# Patient Record
Sex: Male | Born: 1970 | Hispanic: Yes | Marital: Married | Smoking: Former smoker
Health system: Southern US, Community
[De-identification: ages and names within clinical notes are randomized; demographics above are authoritative.]

## PROBLEM LIST (undated history)

## (undated) DIAGNOSIS — Z87442 Personal history of urinary calculi: Secondary | ICD-10-CM

## (undated) DIAGNOSIS — E559 Vitamin D deficiency, unspecified: Secondary | ICD-10-CM

## (undated) HISTORY — DX: Personal history of urinary calculi: Z87.442

## (undated) HISTORY — DX: Vitamin D deficiency, unspecified: E55.9

## (undated) HISTORY — PX: PALATE / UVULA BIOPSY / EXCISION: SUR128

---

## 2015-09-25 DIAGNOSIS — E559 Vitamin D deficiency, unspecified: Secondary | ICD-10-CM | POA: Insufficient documentation

## 2016-10-28 HISTORY — PX: EXTRACORPOREAL SHOCK WAVE LITHOTRIPSY: SHX1557

## 2019-04-08 DIAGNOSIS — N200 Calculus of kidney: Secondary | ICD-10-CM | POA: Insufficient documentation

## 2019-04-09 ENCOUNTER — Encounter: Payer: Self-pay | Admitting: Internal Medicine

## 2019-04-09 ENCOUNTER — Other Ambulatory Visit: Payer: Self-pay

## 2019-04-09 ENCOUNTER — Ambulatory Visit (INDEPENDENT_AMBULATORY_CARE_PROVIDER_SITE_OTHER): Payer: 59 | Admitting: Internal Medicine

## 2019-04-09 ENCOUNTER — Encounter: Payer: Self-pay | Admitting: Gastroenterology

## 2019-04-09 VITALS — BP 118/85 | HR 65 | Temp 96.8°F | Resp 16 | Ht 71.0 in | Wt 176.0 lb

## 2019-04-09 DIAGNOSIS — Z800 Family history of malignant neoplasm of digestive organs: Secondary | ICD-10-CM

## 2019-04-09 DIAGNOSIS — K648 Other hemorrhoids: Secondary | ICD-10-CM | POA: Diagnosis not present

## 2019-04-09 DIAGNOSIS — M25512 Pain in left shoulder: Secondary | ICD-10-CM

## 2019-04-09 DIAGNOSIS — Z125 Encounter for screening for malignant neoplasm of prostate: Secondary | ICD-10-CM | POA: Diagnosis not present

## 2019-04-09 LAB — CBC WITH DIFFERENTIAL/PLATELET
Basophils Absolute: 0 10*3/uL (ref 0.0–0.1)
Basophils Relative: 0.4 % (ref 0.0–3.0)
Eosinophils Absolute: 0.1 10*3/uL (ref 0.0–0.7)
Eosinophils Relative: 1.4 % (ref 0.0–5.0)
HCT: 42.6 % (ref 39.0–52.0)
Hemoglobin: 14.4 g/dL (ref 13.0–17.0)
Lymphocytes Relative: 30.2 % (ref 12.0–46.0)
Lymphs Abs: 2.1 10*3/uL (ref 0.7–4.0)
MCHC: 33.9 g/dL (ref 30.0–36.0)
MCV: 91 fl (ref 78.0–100.0)
Monocytes Absolute: 0.3 10*3/uL (ref 0.1–1.0)
Monocytes Relative: 4.8 % (ref 3.0–12.0)
Neutro Abs: 4.4 10*3/uL (ref 1.4–7.7)
Neutrophils Relative %: 63.2 % (ref 43.0–77.0)
Platelets: 200 10*3/uL (ref 150.0–400.0)
RBC: 4.68 Mil/uL (ref 4.22–5.81)
RDW: 12.4 % (ref 11.5–15.5)
WBC: 6.9 10*3/uL (ref 4.0–10.5)

## 2019-04-09 LAB — BASIC METABOLIC PANEL
BUN: 18 mg/dL (ref 6–23)
CO2: 32 mEq/L (ref 19–32)
Calcium: 9.7 mg/dL (ref 8.4–10.5)
Chloride: 101 mEq/L (ref 96–112)
Creatinine, Ser: 0.86 mg/dL (ref 0.40–1.50)
GFR: 93.69 mL/min (ref >60.00)
Glucose, Bld: 86 mg/dL (ref 70–99)
Potassium: 4.3 mEq/L (ref 3.5–5.1)
Sodium: 138 mEq/L (ref 135–145)

## 2019-04-09 LAB — PSA: PSA: 0.38 ng/mL (ref 0.10–4.00)

## 2019-04-09 MED ORDER — HYDROCORTISONE ACETATE 25 MG RE SUPP: 25.0000 mg | Freq: Two times a day (BID) | RECTAL | 1 refills | Status: AC | PRN

## 2019-04-09 NOTE — Progress Notes (Signed)
Pre visit review using our clinic review tool, if applicable. No additional management support is needed unless otherwise documented below in the visit note. 

## 2019-04-09 NOTE — Progress Notes (Signed)
Subjective:    Patient ID: Shane Chavez, male    DOB: 1970/07/28, 50 y.o.   MRN: [id number]  DOS:  04/09/2019 Type of visit - description: New patient  To get established  Has a long history of internal hemorrhoids, for the last few months have noticed occasional bleeding with bowel movements, typically small amounts of blood in the toilet paper.  Stools are normal color, blood is not mixed with the stools.  2 weeks ago did some heavy lifting, shortly after developed left deltoid shoulder pain.  No neck pain.   Review of Systems Denies fever chills.  No weight loss No abdominal pain No nausea or vomiting Denies major problems with rectal pain or itching. No neck pain   Past Medical History:  Diagnosis Date  . History of kidney stones   . Vitamin D deficiency    Family History  Problem Relation Age of Onset  . Hypertension Mother   . Colon cancer Sister 65  . Diabetes Neg Hx   . Prostate cancer Neg Hx   . CAD Neg Hx     Past Surgical History:  Procedure Laterality Date  . EXTRACORPOREAL SHOCK WAVE LITHOTRIPSY Left 10/28/2016    Allergies as of 04/09/2019   No Known Allergies     Medication List       Accurate as of April 09, 2019 11:59 PM. If you have any questions, ask your nurse or doctor.        acetaminophen 500 MG tablet Commonly known as: TYLENOL Take 500 mg by mouth every 6 (six) hours as needed for moderate pain.   hydrocortisone 25 MG suppository Commonly known as: ANUSOL-HC Place 1 suppository (25 mg total) rectally 2 (two) times daily as needed for hemorrhoids. Started by: Willow Ora, MD   valACYclovir 500 MG tablet Commonly known as: VALTREX Take 500 mg by mouth daily.   VITAMIN C PO Take by mouth.   VITAMIN D PO Take by mouth.          Objective:   Physical Exam BP 118/85 (BP Location: Left Arm, Patient Position: Sitting, Cuff Size: Normal)   Pulse 65   Temp (!) 96.8 F (36 C) (Temporal)   Resp 16   Ht 5\' 11"  (1.803 m)    Wt 176 lb (79.8 kg)   SpO2 100%   BMI 24.55 kg/m  General:   Well developed, NAD, BMI noted.  HEENT:  Normocephalic . Face symmetric, atraumatic Lungs:  CTA B Normal respiratory effort, no intercostal retractions, no accessory muscle use. Heart: RRR,  no murmur.  Abdomen:  Not distended, soft, non-tender. No rebound or rigidity. DRE : Externally has a skin tags, normal sphincter tone, prostate is normal. Anoscopy: Multiple internal hemorrhoids skin: Not pale. Not jaundice Lower extremities: no pretibial edema bilaterally  Neurologic:  alert & oriented X3.  Speech normal, gait appropriate for age and unassisted Psych--  Cognition and judgment appear intact.  Cooperative with normal attention span and concentration.  Behavior appropriate. No anxious or depressed appearing.     Assessment     ASSESSMENT (new patient) History of kidney stones Vitamin D deficiency Internal hemorrhoids  PLAN New patient, to get established Internal hemorrhoids: Intermittent rectal bleeding likely from internal hemorrhoids, DRE anoscopy consistent with internal none hemorrhoids, recommend a high-fiber diet, Anusol suppositories as needed. Check BMP and CBC. Prostate cancer screening: Since we did a DRE, I will check a PSA to complete prostate cancer screening. Denies symptoms. Shoulder injury: Refer to  sports medicine + FH colon cancer, sister at age 49, never had a colonoscopy, referred to GI for a screening colonoscopy RTC CPX 6 months   This visit occurred during the SARS-CoV-2 public health emergency.  Safety protocols were in place, including screening questions prior to the visit, additional usage of staff PPE, and extensive cleaning of exam room while observing appropriate contact time as indicated for disinfecting solutions.

## 2019-04-09 NOTE — Patient Instructions (Signed)
For hemorrhoids: High-fiber diet (fruits, vegetables) and Metamucil 1 capsule daily. Use a suppository when needed. Call if severe pain or bleeding.  We are referring you for a colonoscopy and also to the sports medicine doctor for shoulder pain   GO TO THE LAB : Get the blood work     GO TO THE FRONT DESK, please reschedule your appointments Come back for a physical  exam in 6 months

## 2019-04-10 DIAGNOSIS — Z09 Encounter for follow-up examination after completed treatment for conditions other than malignant neoplasm: Secondary | ICD-10-CM | POA: Insufficient documentation

## 2019-04-10 NOTE — Assessment & Plan Note (Signed)
New patient, to get established Internal hemorrhoids: Intermittent rectal bleeding likely from internal hemorrhoids, DRE anoscopy consistent with internal none hemorrhoids, recommend a high-fiber diet, Anusol suppositories as needed. Check BMP and CBC. Prostate cancer screening: Since we did a DRE, I will check a PSA to complete prostate cancer screening. Denies symptoms. Shoulder injury: Refer to sports medicine + FH colon cancer, sister at age 51, never had a colonoscopy, referred to GI for a screening colonoscopy RTC CPX 6 months

## 2019-05-14 ENCOUNTER — Ambulatory Visit: Payer: Self-pay

## 2019-05-14 ENCOUNTER — Other Ambulatory Visit: Payer: Self-pay

## 2019-05-14 ENCOUNTER — Ambulatory Visit (AMBULATORY_SURGERY_CENTER): Payer: Self-pay | Admitting: *Deleted

## 2019-05-14 ENCOUNTER — Ambulatory Visit (INDEPENDENT_AMBULATORY_CARE_PROVIDER_SITE_OTHER): Payer: No Typology Code available for payment source | Admitting: Family Medicine

## 2019-05-14 ENCOUNTER — Encounter: Payer: Self-pay | Admitting: Family Medicine

## 2019-05-14 VITALS — BP 120/72 | HR 71 | Ht 71.0 in | Wt 175.2 lb

## 2019-05-14 VITALS — Temp 97.5°F | Ht 71.0 in | Wt 175.0 lb

## 2019-05-14 DIAGNOSIS — M25512 Pain in left shoulder: Secondary | ICD-10-CM | POA: Diagnosis not present

## 2019-05-14 DIAGNOSIS — Z800 Family history of malignant neoplasm of digestive organs: Secondary | ICD-10-CM

## 2019-05-14 NOTE — Patient Instructions (Addendum)
Thank you for coming in today.  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  I agree with avoiding heavy lifting.  I think physical therapy is a good idea.  I placed a referral to PT at [facility].  Recheck with me in 4-6 weeks or sooner if needed.  I am happy to try an injection at anytime.  I would recommend an MRI in 4 weeks if not better.

## 2019-05-14 NOTE — Progress Notes (Signed)
Subjective:    I'm seeing this patient as a consultation for:  Dr. Drue Novel. Note will be routed back to referring provider/PCP.  CC: L shoulder pain  I, Molly Weber, LAT, ATC, am serving as scribe for Dr. Clementeen Graham.  HPI: Pt is a 49 y/o male presenting w/ c/o L shoulder pain x approximately 4 weeks after doing some heavy lifting at work.  He was lifting boxes at work that were around 60-70 lbs.  He locates his shoulder pain to his L anterior shoulder.  He rates his pain as mild and describes his pain as sharp .  He has been seen by a chiropractor which is improving his pain.  Over the last 4 weeks he is now able to abduct his arm more normally with less pain.  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 injury and has been seen by [facility] health occupational medicine clinic.  Work note was written limiting lifting to more when 10 pounds.  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  He notes that he is also been pulled out of work for this.  He is eager to return to work at least in his second more sedentary job.  He thinks he probably could return to work in his main job as well at this point.  He does continue to have pain especially with overhead motion.  Radiating pain: intermittently into his L proximal upper arm to to his neck L shoulder mechanical symptoms: No Aggravating factors: lifting anything heavy; trying to reach overhead Treatments tried: chiropractor; IBU; ice; topical pain relieving gel/cream   Past medical history, Surgical history, Family history, Social history, Allergies, and medications have been entered into the medical record, reviewed.   Review of Systems: No new headache, visual changes, nausea, vomiting, diarrhea, constipation, dizziness, abdominal pain, skin rash, fevers, chills, night sweats, weight loss, swollen lymph nodes,  body aches, joint swelling, muscle aches, chest pain, shortness of breath, mood changes, visual or auditory hallucinations.   Objective:    Vitals:   05/14/19 1420  BP: 120/72  Pulse: 71  SpO2: 98%   General: Well Developed, well nourished, and in no acute distress.  Neuro/Psych: Alert and oriented x3, extra-ocular muscles intact, able to move all 4 extremities, sensation grossly intact. Skin: Warm and dry, no rashes noted.  Respiratory: Not using accessory muscles, speaking in full sentences, trachea midline.  Cardiovascular: Pulses palpable, no extremity edema. Abdomen: Does not appear distended. MSK:  .  C-spine normal. Nontender midline.  Tender palpation left trapezius. Normal cervical motion. Left shoulder normal-appearing tender palpation trapezius and rhomboid. Abduction full however pain beyond 100 degrees. External rotation limited 30 degrees by neutral position. Internal rotation limited to lumbar spine. Strength intact abduction external/internal rotation. Positive Hawkins and Neer's test positive empty can test. Negative Yergason's and speeds test. Pulses cap refill and sensation are intact distally.  Lab and Radiology Results  Diagnostic Limited MSK Ultrasound of: Left shoulder Biceps tendon intact normal-appearing at bicipital groove. Subscapularis tendon partially visualized inability to fully externally rotate shoulder.  Portion visible normal-appearing. Supraspinatus tendon normal-appearing without tear. Infraspinatus normal-appearing without tear. AC joint mild narrowing. 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   Impression and Recommendations:    Assessment and Plan: 49 y.o. male with shoulder pain.  Ongoing  for 4 weeks occurring with an injury at work.  Ultrasound examination does not show obvious tear however subscapularis tendon was only partially visible due to body positioning. Fortunately he is improving with  simple conservative management.  I think he would benefit from formal physical therapy and I placed a referral today.  Additionally we spent time discussing his work situation.  It is a bit complicated at this point and the [employer] doctor is effectively determining work and return to work status.  I believe he probably could return to work now however he like to wait until his appointment on this upcoming Wednesday with work Dentist.  Happy to write a letter if needed..   Orders Placed This Encounter  Procedures  . [place] LIMITED JOINT SPACE STRUCTURES UP LEFT(NO LINKED CHARGES)    Order Specific Question:   Reason for Exam (SYMPTOM  OR DIAGNOSIS REQUIRED)    Answer:   L shoulder pain    Order Specific Question:   Preferred imaging location?    Answer:   [place]  . Ambulatory referral to Physical Therapy    Referral Priority:   Routine    Referral Type:   Physical Medicine    Referral Reason:   Specialty Services Required    Requested Specialty:   Physical Therapy   No orders of the defined types were placed in this encounter.   Discussed warning signs or symptoms. Please see discharge instructions. Patient expresses understanding.   The above documentation has been reviewed and is accurate and complete Lynne Leader

## 2019-05-14 NOTE — Progress Notes (Signed)
Patient is here in-person for PV. Patient denies any allergies to eggs or soy. Patient denies any problems with anesthesia/sedation. Patient denies any oxygen use at home. Patient denies taking any diet/weight loss medications or blood thinners. Patient is not being treated for MRSA or C-diff. EMMI education assisgned to the patient for the procedure, this was explained and instructions given to patient. Patient is aware of our care-partner policy and Covid-19 safety protocol.   Completed covid vaccines per pt on 05/08/19.

## 2019-05-19 ENCOUNTER — Encounter: Payer: Self-pay | Admitting: Gastroenterology

## 2019-05-24 ENCOUNTER — Ambulatory Visit (AMBULATORY_SURGERY_CENTER): Payer: 59 | Admitting: Gastroenterology

## 2019-05-24 ENCOUNTER — Encounter: Payer: Self-pay | Admitting: Gastroenterology

## 2019-05-24 ENCOUNTER — Other Ambulatory Visit: Payer: Self-pay

## 2019-05-24 VITALS — BP 109/73 | HR 59 | Temp 96.8°F | Resp 14 | Ht 71.0 in | Wt 175.0 lb

## 2019-05-24 DIAGNOSIS — Z1211 Encounter for screening for malignant neoplasm of colon: Secondary | ICD-10-CM

## 2019-05-24 DIAGNOSIS — Z800 Family history of malignant neoplasm of digestive organs: Secondary | ICD-10-CM

## 2019-05-24 MED ORDER — SODIUM CHLORIDE 0.9 % IV SOLN: 500.0000 mL | Freq: Once | INTRAVENOUS | Status: DC

## 2019-05-24 NOTE — Patient Instructions (Signed)
Information on hemorrhoids given to you today.  Eat a high fiber diet.   YOU HAD AN ENDOSCOPIC PROCEDURE TODAY AT THE [facility]:   Refer to the procedure report that was given to you for any specific questions about what was found during the examination.  If the procedure report does not answer your questions, please call your gastroenterologist to clarify.  If you requested that your care partner not be given the details of your procedure findings, then the procedure report has been included in a sealed envelope for you to review at your convenience later.  YOU SHOULD EXPECT: Some feelings of bloating in the abdomen. Passage of more gas than usual.  Walking can help get rid of the air that was put into your GI tract during the procedure and reduce the bloating. If you had a lower endoscopy (such as a colonoscopy or flexible sigmoidoscopy) you may notice spotting of blood in your stool or on the toilet paper. If you underwent a bowel prep for your procedure, you may not have a normal bowel movement for a few days.  Please Note:  You might notice some irritation and congestion in your nose or some drainage.  This is from the oxygen used during your procedure.  There is no need for concern and it should clear up in a day or so.  SYMPTOMS TO REPORT IMMEDIATELY:   Following lower endoscopy (colonoscopy or flexible sigmoidoscopy):  Excessive amounts of blood in the stool  Significant tenderness or worsening of abdominal pains  Swelling of the abdomen that is new, acute  Fever of 100F or higher  For urgent or emergent issues, a gastroenterologist can be reached at any hour by calling [phone]. Do not use MyChart messaging for urgent concerns.    DIET:  We do recommend a small meal at first, but then you may proceed to your regular diet.  Drink plenty of fluids but you should avoid alcoholic beverages for 24 hours.  ACTIVITY:  You should plan to take it easy for the rest of  today and you should NOT DRIVE or use heavy machinery until tomorrow (because of the sedation medicines used during the test).    FOLLOW UP: Our staff will call the number listed on your records 48-72 hours following your procedure to check on you and address any questions or concerns that you may have regarding the information given to you following your procedure. If we do not reach you, we will leave a message.  We will attempt to reach you two times.  During this call, we will ask if you have developed any symptoms of COVID 19. If you develop any symptoms (ie: fever, flu-like symptoms, shortness of breath, cough etc.) before then, please call [phone].  If you test positive for Covid 19 in the 2 weeks post procedure, please call and report this information to [place].    If any biopsies were taken you will be contacted by phone or by letter within the next 1-3 weeks.  Please call us at [phone] if you have not heard about the biopsies in 3 weeks.    SIGNATURES/CONFIDENTIALITY: You and/or your care partner have signed paperwork which will be entered into your electronic medical record.  These signatures attest to the fact that that the information above on your After Visit Summary has been reviewed and is understood.  Full responsibility of the confidentiality of this discharge information lies with you and/or your care-partner.

## 2019-05-24 NOTE — Op Note (Signed)
[facility] Patient Name: Shane Chavez Procedure Date: 05/24/2019 8:31 AM MRN: [id number] Endoscopist: Lynann Bologna , MD Age: 49 Referring MD:  Date of Birth: 05/02/1970 Gender: Male Account #: [id number] Procedure:                Colonoscopy Indications:              Screening in patient at increased risk: Colorectal                            cancer in sister at age 74. Medicines:                Monitored Anesthesia Care Procedure:                Pre-Anesthesia Assessment:                           - Prior to the procedure, a History and Physical                            was performed, and patient medications and                            allergies were reviewed. The patient's tolerance of                            previous anesthesia was also reviewed. The risks                            and benefits of the procedure and the sedation                            options and risks were discussed with the patient.                            All questions were answered, and informed consent                            was obtained. Prior Anticoagulants: The patient has                            taken no previous anticoagulant or antiplatelet                            agents. ASA Grade Assessment: II - A patient with                            mild systemic disease. After reviewing the risks                            and benefits, the patient was deemed in                            satisfactory condition to undergo the procedure.  After obtaining informed consent, the colonoscope                            was passed under direct vision. Throughout the                            procedure, the patient's blood pressure, pulse, and                            oxygen saturations were monitored continuously. The                            Colonoscope was introduced through the anus and                            advanced to the the cecum, identified  by                            appendiceal orifice and ileocecal valve. The                            colonoscopy was performed without difficulty. The                            patient tolerated the procedure well. The quality                            of the bowel preparation was adequate to identify                            polyps. The ileocecal valve, appendiceal orifice,                            and rectum were photographed. Scope In: 8:45:43 AM Scope Out: 9:01:22 AM Scope Withdrawal Time: 0 hours 9 minutes 48 seconds  Total Procedure Duration: 0 hours 15 minutes 39 seconds  Findings:                 The colon (entire examined portion) appeared                            normal. The colon was highly redundant.                           Non-bleeding internal hemorrhoids were found during                            retroflexion and during perianal exam. The                            hemorrhoids were small.                           The exam was otherwise without abnormality on  direct and retroflexion views. Complications:            No immediate complications. Estimated Blood Loss:     Estimated blood loss: none. Impression:               - Non-bleeding internal hemorrhoids.                           - Otherwise normal colonoscopy. Recommendation:           - Patient has a contact number available for                            emergencies. The signs and symptoms of potential                            delayed complications were discussed with the                            patient. Return to normal activities tomorrow.                            Written discharge instructions were provided to the                            patient.                           - High-fiber diet.                           - Continue present medications.                           - Repeat colonoscopy in 3 years for screening                            purposes. Earlier,  if with any new problems or if                            there is any change in family history.                           - Return to GI clinic PRN. Lynann Bologna, MD 05/24/2019 9:06:25 AM This report has been signed electronically.

## 2019-05-24 NOTE — Progress Notes (Signed)
Report to PACU, RN, vss, BBS= Clear.  

## 2019-05-24 NOTE — Progress Notes (Signed)
Pt's states no medical or surgical changes since previsit or office visit.   TEMP-JB   V/S-KA 

## 2019-05-26 ENCOUNTER — Telehealth: Payer: Self-pay | Admitting: *Deleted

## 2019-05-26 NOTE — Telephone Encounter (Signed)
No answer for post procedure call back. Left message for patient to call with questions and concerns. 

## 2019-05-26 NOTE — Telephone Encounter (Signed)
  Follow up Call-  Call back number 05/24/2019  Post procedure Call Back phone  # [phone]  Permission to leave phone message Yes     Patient questions:  Do you have a fever, pain , or abdominal swelling? No. Pain Score  0 *  Have you tolerated food without any problems? Yes.    Have you been able to return to your normal activities? Yes.    Do you have any questions about your discharge instructions: Diet   No. Medications  No. Follow up visit  No.  Do you have questions or concerns about your Care? No.  Actions: * If pain score is 4 or above: No action needed, pain <4.  1. Have you developed a fever since your procedure? no  2.   Have you had an respiratory symptoms (SOB or cough) since your procedure? no  3.   Have you tested positive for COVID 19 since your procedure no  4.   Have you had any family members/close contacts diagnosed with the COVID 19 since your procedure?  no   If yes to any of these questions please route to Laverna Peace, RN and Charlett Lango, RN

## 2019-06-08 ENCOUNTER — Other Ambulatory Visit: Payer: Self-pay

## 2019-06-08 ENCOUNTER — Encounter: Payer: Self-pay | Admitting: Internal Medicine

## 2019-06-08 ENCOUNTER — Ambulatory Visit (INDEPENDENT_AMBULATORY_CARE_PROVIDER_SITE_OTHER): Payer: 59 | Admitting: Internal Medicine

## 2019-06-08 VITALS — BP 146/92 | HR 78 | Temp 97.9°F | Resp 18 | Ht 71.0 in | Wt 178.1 lb

## 2019-06-08 DIAGNOSIS — S4990XS Unspecified injury of shoulder and upper arm, unspecified arm, sequela: Secondary | ICD-10-CM

## 2019-06-08 DIAGNOSIS — Z09 Encounter for follow-up examination after completed treatment for conditions other than malignant neoplasm: Secondary | ICD-10-CM

## 2019-06-08 NOTE — Progress Notes (Signed)
   Subjective:    Patient ID: Shane Chavez, male    DOB: 1970/03/11, 49 y.o.   MRN: [id number]  DOS:  06/08/2019 Type of visit - description: Acute The patient injured his shoulder, was seen by [facility] doctor and subsequently by Dr. Denyse Amass. Currently doing physical therapy as prescribed by the [employer] doctor. Needs paperwork to be fille.    Review of Systems See above   Past Medical History:  Diagnosis Date  . History of kidney stones   . Vitamin D deficiency     Past Surgical History:  Procedure Laterality Date  . EXTRACORPOREAL SHOCK WAVE LITHOTRIPSY Left 10/28/2016  . PALATE / UVULA BIOPSY / EXCISION     at age 26 per pt    Allergies as of 06/08/2019   No Known Allergies     Medication List       Accurate as of Jun 08, 2019  2:27 PM. If you have any questions, ask your nurse or doctor.        acetaminophen 500 MG tablet Commonly known as: TYLENOL Take 500 mg by mouth every 6 (six) hours as needed for moderate pain.   hydrocortisone 25 MG suppository Commonly known as: ANUSOL-HC Place 1 suppository (25 mg total) rectally 2 (two) times daily as needed for hemorrhoids.   valACYclovir 500 MG tablet Commonly known as: VALTREX Take 500 mg by mouth daily.   VITAMIN C PO Take by mouth.   VITAMIN D PO Take by mouth.          Objective:   Physical Exam BP (!) 146/92 (BP Location: Left Arm, Patient Position: Sitting, Cuff Size: Small)   Pulse 78   Temp 97.9 F (36.6 C) (Temporal)   Resp 18   Ht 5\' 11"  (1.803 m)   Wt 178 lb 2 oz (80.8 kg)   SpO2 100%   BMI 24.84 kg/m  General:   Well developed, NAD, BMI noted. HEENT:  Normocephalic . Face symmetric, atraumatic Neurologic:  alert & oriented X3.  Speech normal, gait appropriate for age and unassisted Psych--  Cognition and judgment appear intact.  Cooperative with normal attention span and concentration.  Behavior appropriate. No anxious or depressed appearing.        Assessment     ASSESSMENT (new patient 03/2019) History of kidney stones Vitamin D deficiency Internal hemorrhoids  PLAN Shoulder injury: Under the care of Worker's Comp. physician and a sports medicine doctor.  Request paperwork to be filled by me, I declined, needs to be done by one of the other 2 doctors that are already taking care of him.    This visit occurred during the SARS-CoV-2 public health emergency.  Safety protocols were in place, including screening questions prior to the visit, additional usage of staff PPE, and extensive cleaning of exam room while observing appropriate contact time as indicated for disinfecting solutions.

## 2019-06-08 NOTE — Progress Notes (Signed)
Pre visit review using our clinic review tool, if applicable. No additional management support is needed unless otherwise documented below in the visit note. 

## 2019-06-09 NOTE — Assessment & Plan Note (Signed)
Shoulder injury: Under the care of Worker's Comp. physician and a sports medicine doctor.  Request paperwork to be filled by me, I declined, needs to be done by one of the other 2 doctors that are already taking care of him.

## 2019-06-16 ENCOUNTER — Other Ambulatory Visit: Payer: Self-pay | Admitting: Family Medicine

## 2019-06-16 ENCOUNTER — Other Ambulatory Visit: Payer: Self-pay

## 2019-06-16 ENCOUNTER — Ambulatory Visit: Payer: Self-pay

## 2019-06-16 DIAGNOSIS — M25511 Pain in right shoulder: Secondary | ICD-10-CM

## 2019-08-24 ENCOUNTER — Telehealth: Payer: Self-pay | Admitting: Internal Medicine

## 2019-08-24 NOTE — Telephone Encounter (Signed)
New message:   Delray Alt is calling from [employer] to see if the pt has had an injury to his right shoulder. Claim # [id number]. Please advise.

## 2019-08-24 NOTE — Telephone Encounter (Signed)
Spoke w/ [employer]- they had questions we were unable to answer. Recommended they call patient directly to get workers comp providers name and number.

## 2019-09-07 ENCOUNTER — Encounter: Payer: Self-pay | Admitting: Family Medicine

## 2019-09-07 ENCOUNTER — Ambulatory Visit (INDEPENDENT_AMBULATORY_CARE_PROVIDER_SITE_OTHER): Payer: 59 | Admitting: Family Medicine

## 2019-09-07 ENCOUNTER — Other Ambulatory Visit: Payer: Self-pay

## 2019-09-07 VITALS — BP 130/88 | HR 65 | Ht 71.0 in | Wt 185.8 lb

## 2019-09-07 DIAGNOSIS — M25512 Pain in left shoulder: Secondary | ICD-10-CM

## 2019-09-07 NOTE — Progress Notes (Signed)
   I, [name], LAT, ATC, am serving as scribe for Dr. Clementeen Graham.  Shane Chavez is a 49 y.o. male who presents to [facility] at [facility] today for f/u of L shoulder pain.  He was last seen by Dr. Denyse Amass on 05/14/19 after injuring himself at work approximately one month previously when lifting heavier boxes.  He was referred to PT and [facility] but did not attend PT. Since his last visit, pt reports that his case got denied through [employer] and is now needing to pay through his insurance and needs FMLA paperwork filled.  He states that initially he was doing better until his treatment was denied.  Currently, he rates his improvement at 60%.  He con't to have pain w/ reaching and pulling.  He is working part-time at [employer].  His main appointment would not allow him to return to work with any restriction.  Right now he does not think he can work without restrictions.  Previous restriction prior to [employer]. ending was 50 pound lifting restriction.   Was doing PT at [facility] phone [phone] fax: [phone]  Had MRI with Occupation doctor about 1 month ago that showed no tear per patient report.   Diagnostic imaging: L shoulder XR- 06/16/19  Pertinent review of systems: No fevers or chills  Relevant historical information: Kidney stone and vitamin D deficiency.   Exam:  BP 130/88 (BP Location: Right Arm, Patient Position: Sitting, Cuff Size: Normal)   Pulse 65   Ht 5\' 11"  (1.803 m)   Wt 185 lb 12.8 oz (84.3 kg)   SpO2 99%   BMI 25.91 kg/m  General: Well Developed, well nourished, and in no acute distress.   MSK: Left shoulder normal-appearing Nontender. Normal motion. Normal strength. Positive Hawkins and Neer's test.  Positive empty can test. Negative Yergason's and speeds test.     Assessment and Plan: 49 y.o. male with left shoulder pain.  Per patient report not quite ready to return to work full duty.  Will complete  paperwork and hold out of work until September 27.  We will see back in a month.  If getting better sooner will return to work sooner.  Main treatment will be physical therapy.  This was ongoing before it ended because Worker's Comp. was rejected.  Will refer back to same physical therapy location.  Patient will see me a copy of his MRI that was done to his occupational medicine doctor.  This should be helpful as well.    Orders Placed This Encounter  Procedures  . Ambulatory referral to Physical Therapy    Referral Priority:   Routine    Referral Type:   Physical Medicine    Referral Reason:   Specialty Services Required    Requested Specialty:   Physical Therapy    Number of Visits Requested:   1   No orders of the defined types were placed in this encounter.    Discussed warning signs or symptoms. Please see discharge instructions. Patient expresses understanding.   The above documentation has been reviewed and is accurate and complete September 29, M.D.  Total encounter time 20 minutes including charting time date of service.

## 2019-09-07 NOTE — Patient Instructions (Addendum)
Thank you for coming in today. I will complete the paperwork for your job by the end of this week.  I expect to hold you out of work for 1 month, however if you get better sooner let me know and I can return you to work sooner.   Recheck in 4 weeks.   Estimated return to work date is Sep 27th   Let me know if you have a problem with PT.

## 2019-09-14 ENCOUNTER — Telehealth: Payer: Self-pay | Admitting: Family Medicine

## 2019-09-14 NOTE — Telephone Encounter (Signed)
Patient called checking the status of his short term disability paperwork. He wanted to make sure that this had been completed and faxed.

## 2019-09-14 NOTE — Telephone Encounter (Signed)
Paperwork was completed and faxed on 09/08/19.

## 2019-10-05 ENCOUNTER — Other Ambulatory Visit: Payer: Self-pay

## 2019-10-05 ENCOUNTER — Ambulatory Visit (INDEPENDENT_AMBULATORY_CARE_PROVIDER_SITE_OTHER): Payer: 59 | Admitting: Family Medicine

## 2019-10-05 ENCOUNTER — Encounter: Payer: Self-pay | Admitting: Family Medicine

## 2019-10-05 VITALS — BP 160/100 | HR 75 | Ht 71.0 in | Wt 184.0 lb

## 2019-10-05 DIAGNOSIS — M25512 Pain in left shoulder: Secondary | ICD-10-CM | POA: Diagnosis not present

## 2019-10-05 NOTE — Progress Notes (Signed)
   Wynema Birch, am serving as a Neurosurgeon for Dr. Clementeen Graham.  Shane Chavez is a 49 y.o. male who presents to [facility] at [facility] today for f/u of L shoulder pain.  He was last seen by Dr. Denyse Amass on 09/07/19 and was referred to PT at [facility].  He also had his FMLA paperwork completed.  Since his last visit, pt reports that his L shoulder is still bothering him he is able to get into PT for next week. Patient states that he did have to lift a box at his job and that is not helping with the pain.  As noted during his last visit he was advised to have a 40 pound lifting restriction that his work would not allow.  He has been unable to return to full duty at his primary job due to this 40 pound lifting restriction.  He received a letter from his primary job saying that he is likely to lose employment.  He notes he still is not able to lift heavy weights and cannot return to full normal work yet.  Diagnostic testing: L shoulder XR- 06/16/19  Pertinent review of systems: No fevers or chills  Relevant historical information: Kidney stone   Exam:  BP (!) 160/100 (BP Location: Left Arm, Patient Position: Sitting, Cuff Size: Normal)   Pulse 75   Ht 5\' 11"  (1.803 m)   Wt 184 lb (83.5 kg)   SpO2 99%   BMI 25.66 kg/m  General: Well Developed, well nourished, and in no acute distress.   MSK: Left shoulder normal-appearing normal motion. Strength 4+/5 abduction.  5/5 internal rotation 4/5 external rotation. Positive Hawkins and Neer's test.     Assessment and Plan: 49 y.o. male with continued left shoulder pain with weakness.  Patient has continuing physical therapy which will be starting next week.  He still is not able to return to full duty and with his job that requires heavy lifting.  We will extend work note if needed.  Otherwise recheck back with me in about 6 weeks.    Discussed warning signs or symptoms. Please see discharge instructions. Patient expresses  understanding.   The above documentation has been reviewed and is accurate and complete 44, M.D.    Total encounter time 20 minutes including face-to-face time with the patient and charting on the date of service.

## 2019-10-05 NOTE — Patient Instructions (Addendum)
Thank you for coming in today.  Lets give PT a month  Recheck with me in about 6 weeks.  I can extend work note or forms as needed.  Let me know.  Current form will expire on 9/27.

## 2019-10-08 ENCOUNTER — Encounter: Payer: 59 | Admitting: Internal Medicine

## 2019-10-11 ENCOUNTER — Telehealth: Payer: Self-pay | Admitting: Family Medicine

## 2019-10-11 NOTE — Telephone Encounter (Signed)
Faxed [employer] STD paperwork 10/11/19, sent to scan and mailed copy to pt.

## 2019-11-16 ENCOUNTER — Other Ambulatory Visit: Payer: Self-pay

## 2019-11-16 ENCOUNTER — Ambulatory Visit (INDEPENDENT_AMBULATORY_CARE_PROVIDER_SITE_OTHER): Payer: 59 | Admitting: Family Medicine

## 2019-11-16 ENCOUNTER — Encounter: Payer: Self-pay | Admitting: Family Medicine

## 2019-11-16 VITALS — BP 132/94 | HR 65 | Ht 71.0 in | Wt 179.4 lb

## 2019-11-16 DIAGNOSIS — M25512 Pain in left shoulder: Secondary | ICD-10-CM | POA: Diagnosis not present

## 2019-11-16 NOTE — Progress Notes (Signed)
   I, [name], LAT, ATC, am serving as scribe for Dr. Clementeen Graham.  Shane Chavez is a 49 y.o. male who presents to [facility] at [facility] today for f/u of L shoulder pain.  He was last seen by Dr. Denyse Amass on 10/05/19 to complete FMLA forms due to con't issue w/ overhead lifting making him unable to complete all of his occupational duties.  He was referred to PT to [facility].  Since his last visit, pt reports that his L shoulder is feeling better and rates his improvement at 65-70%.  He has been to PT but his PT thinks that he needs con't treatment and rehab.  He states that he is able to reach behind his back now w/ less pain.  Diagnostic testing- L shoulder XR- 06/16/19   Pertinent review of systems: No fevers or chills  Relevant historical information: Vitamin D deficiency   Exam:  BP (!) 132/94 (BP Location: Right Arm, Patient Position: Sitting, Cuff Size: Normal)   Pulse 65   Ht 5\' 11"  (1.803 m)   Wt 179 lb 6.4 oz (81.4 kg)   SpO2 99%   BMI 25.02 kg/m  General: Well Developed, well nourished, and in no acute distress.   MSK: Left shoulder normal. Range of motion full. Strength decreased 4/5 to abduction external and internal rotation. Positive Hawkins and Neer's test.     Assessment and Plan: 49 y.o. male with left shoulder pain.  Significant improvement with physical therapy estimated 65 to 70%.  Unfortunately he still needs a lifting restriction at work and his work would not allow him to return to work with any restrictions.  Therefore he will remain out of work.  Estimate return to work in 1 to 2 months.  Reassess after physical therapy completes in about 2 months if needed.  Letter written for work today.  We will fill out updated FMLA or short-term disability forms if needed.     Discussed warning signs or symptoms. Please see discharge instructions. Patient expresses understanding.   The above documentation has been reviewed and is accurate  and complete 44, M.D.

## 2019-11-16 NOTE — Patient Instructions (Addendum)
Thank you for coming in today. Continue PT.   Let me know how you feel after PT.  If better I will release you to work.  Return if needed or if still need to be out of work after 2 months.

## 2020-01-05 NOTE — Progress Notes (Signed)
° °  I, [name], LAT, ATC, am serving as scribe for Dr. Clementeen Graham.  Shane Chavez is a 49 y.o. male who presents to [facility] at [facility] today for f/u L shoulder pain. Pt was last seen by Dr. Denyse Amass on 11/16/19 and was advised to remain out of work due to lifting restrictions, estimated return 1-2 month, and to cont PT at [facility] of which he's been compliant. Since his last visit, pt reports that his L shoulder is getting better.  He has con't PT and feels like he is making progress.  Previously had a 50 pound lifting restriction which was not compatible with his return to previous employment level.  [facility] sent a report last week.  Stating that he is about 70% better but still having weakness and pain with overhead motion and still recommend continued lifting restriction. Recommend 1 more month of physical therapy.   Dx imaging: 06/16/19 L shoulder XR  Pertinent review of systems: No fevers or chills  Relevant historical information: Family history of colon cancer.   Exam:  BP 130/88 (BP Location: Right Arm, Patient Position: Sitting, Cuff Size: Normal)    Pulse 73    Ht 5\' 11"  (1.803 m)    Wt 180 lb 9.6 oz (81.9 kg)    SpO2 98%    BMI 25.19 kg/m  General: Well Developed, well nourished, and in no acute distress.   MSK: Left shoulder normal-appearing normal motion.  Strength 4/5 abduction and external rotation.       Assessment and Plan: 49 y.o. male with continued left shoulder pain especially with the heavy duty overhead lifting.  Patient will continue to have a lifting restriction overhead which is modest and is not compatible with his current work status.   Anticipate this work restriction will last until either released from physical therapy and in 1 month or longer.  Plan to check back with me in 3 months.  Patient likely will be reaching maximum medical improvement by 42-month mark.  However am fearful that he will still need some sort of  lifting restriction and may not be able to return to work at all with no lifting restriction.    Discussed warning signs or symptoms. Please see discharge instructions. Patient expresses understanding.   The above documentation has been reviewed and is accurate and complete 2-month, M.D.

## 2020-01-10 ENCOUNTER — Encounter: Payer: Self-pay | Admitting: Family Medicine

## 2020-01-10 ENCOUNTER — Other Ambulatory Visit: Payer: Self-pay

## 2020-01-10 ENCOUNTER — Ambulatory Visit (INDEPENDENT_AMBULATORY_CARE_PROVIDER_SITE_OTHER): Payer: 59 | Admitting: Family Medicine

## 2020-01-10 VITALS — BP 130/88 | HR 73 | Ht 71.0 in | Wt 180.6 lb

## 2020-01-10 DIAGNOSIS — M25512 Pain in left shoulder: Secondary | ICD-10-CM | POA: Diagnosis not present

## 2020-01-10 NOTE — Patient Instructions (Signed)
Thank you for coming in today.  Recheck following completion of completion of PT when released by PT to start work or in 3 months.

## 2020-01-28 ENCOUNTER — Telehealth: Payer: Self-pay | Admitting: Family Medicine

## 2020-01-28 NOTE — Telephone Encounter (Signed)
Pt advises that [employer] has not received any STD paperwork from [place] since his last visit in December. Advised pt to have [employer] refax request. Request received and no MD note needed, just medical records. Sent request to HIM for appropriate release of records.

## 2020-02-22 ENCOUNTER — Telehealth: Payer: Self-pay | Admitting: Family Medicine

## 2020-02-22 NOTE — Telephone Encounter (Signed)
Received physical therapy report from [facility] dated February 17, 2020.  Patient has been seen 11 times and has 80-85% improvement.  He has continued limitations of pain and tightness especially with internal rotation.  Physical therapy assessment is to continue physical therapy once weekly for 4 more weeks.

## 2020-03-31 ENCOUNTER — Encounter: Payer: Self-pay | Admitting: Internal Medicine

## 2020-03-31 ENCOUNTER — Telehealth: Payer: Self-pay | Admitting: Family Medicine

## 2020-03-31 NOTE — Telephone Encounter (Signed)
I received a form from [employer] benefit center  Have you return to work? What limitations do have if not?  Clayburn Pert

## 2020-04-03 NOTE — Telephone Encounter (Signed)
Called pt and LM for him to return call to answer Dr. Zollie Pee questions regarding RTW status.  Please get info needed by Dr. Denyse Amass to complete FMLA/disability forms for pt's work/job.

## 2020-04-03 NOTE — Telephone Encounter (Signed)
Patient returned call. He said that he has not returned to work yet. He is still continuing with physical therapy. He feels that he is at 90% and can currently lift no more than 60 pounds.

## 2020-04-04 NOTE — Telephone Encounter (Signed)
Form completed.

## 2020-05-04 ENCOUNTER — Telehealth: Payer: Self-pay | Admitting: Family Medicine

## 2020-05-04 NOTE — Telephone Encounter (Signed)
Message left after hours:  Dr Tad Moore from [facility] called asking to speak with Dr Denyse Amass regarding the restrictions and limitations that were given for the patient. She can be reached at [phone].

## 2020-05-08 NOTE — Telephone Encounter (Signed)
I spoke with Dr. Tad Moore at [facility].  She wanted to know if he was likely to get better and I stated that I am pretty sure he will not get fully better but to be sure about his actual current restrictions a functional capacity evaluation would be the logical next step.  She will take this back to the team and let me know what their next steps are.

## 2020-09-27 IMAGING — DX DG SHOULDER 2+V*L*
3 series · 3 of 3 positions shown · non-contrast
Comparison: None.

CLINICAL DATA: Persistent left shoulder pain. Repetitive lifting at
work.

EXAM:
LEFT SHOULDER - 2+ VIEW

[shoulder ap]
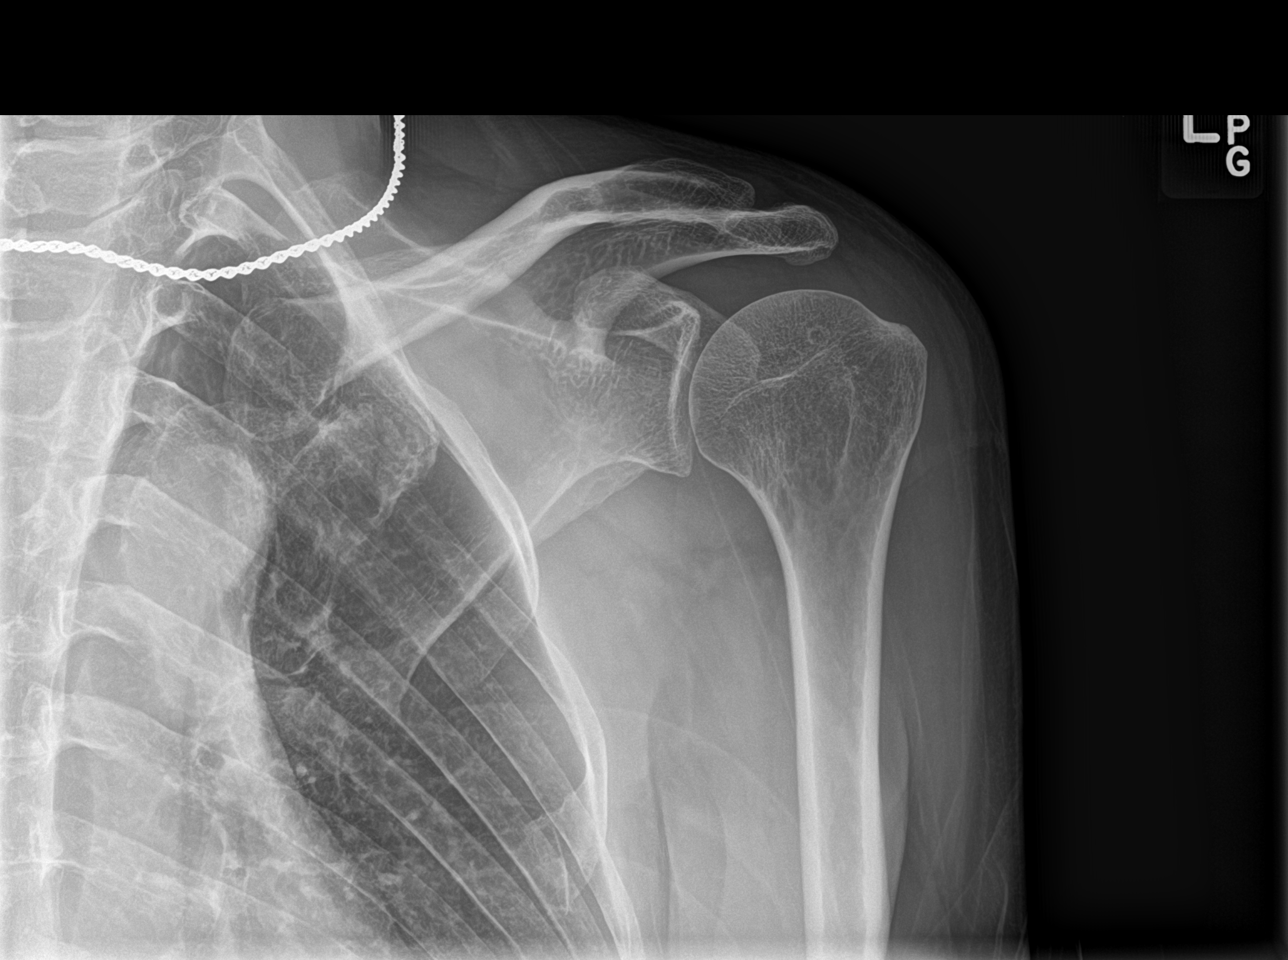

[shoulder y-view]
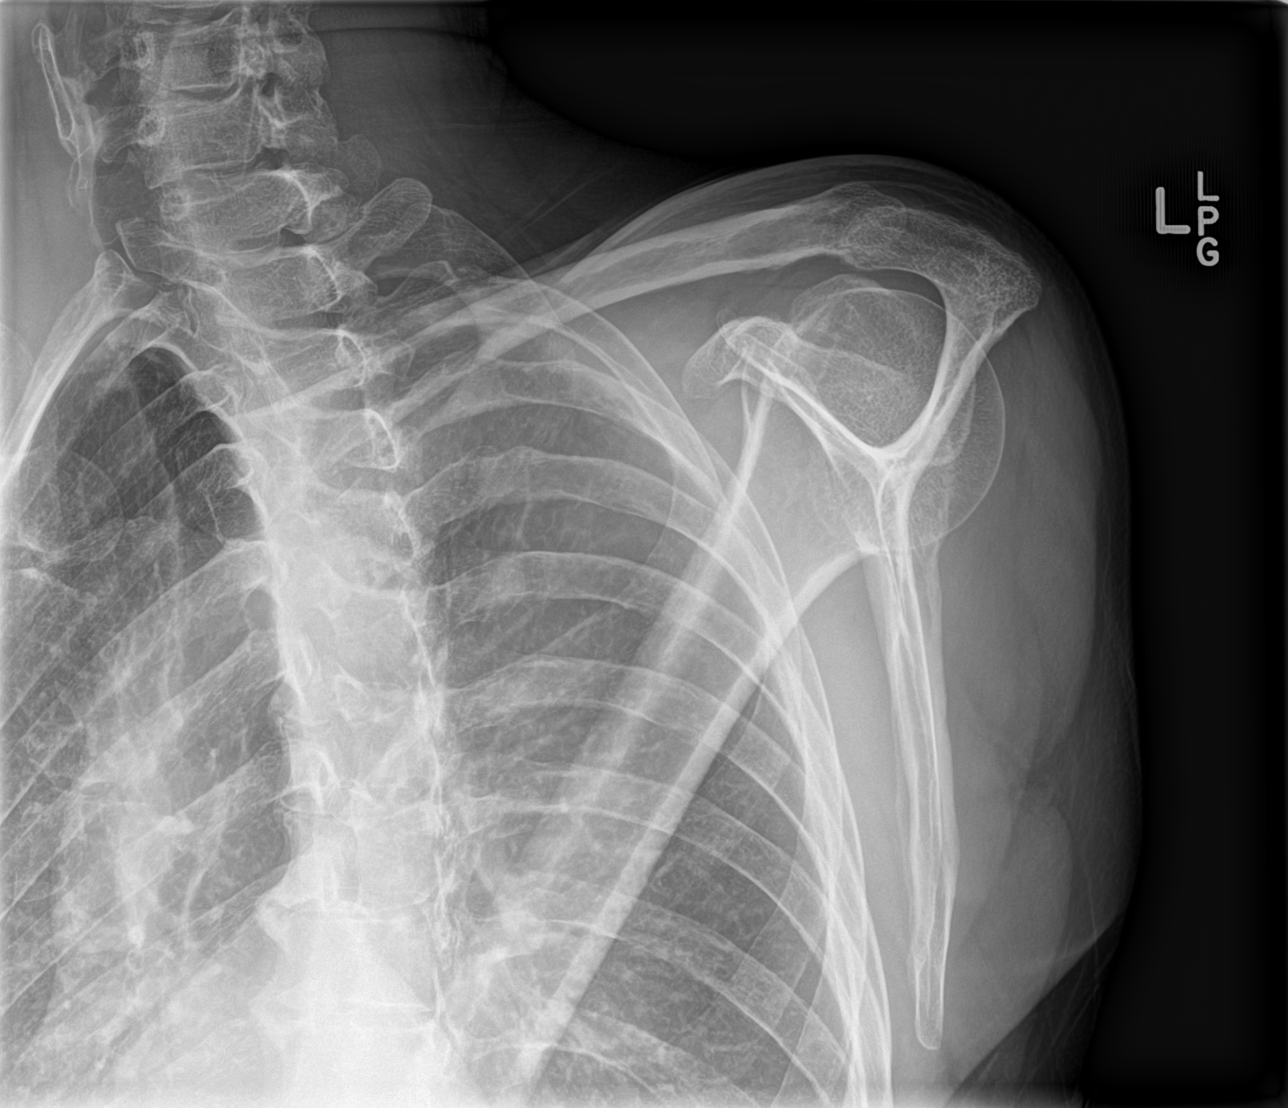

[shoulder axial]
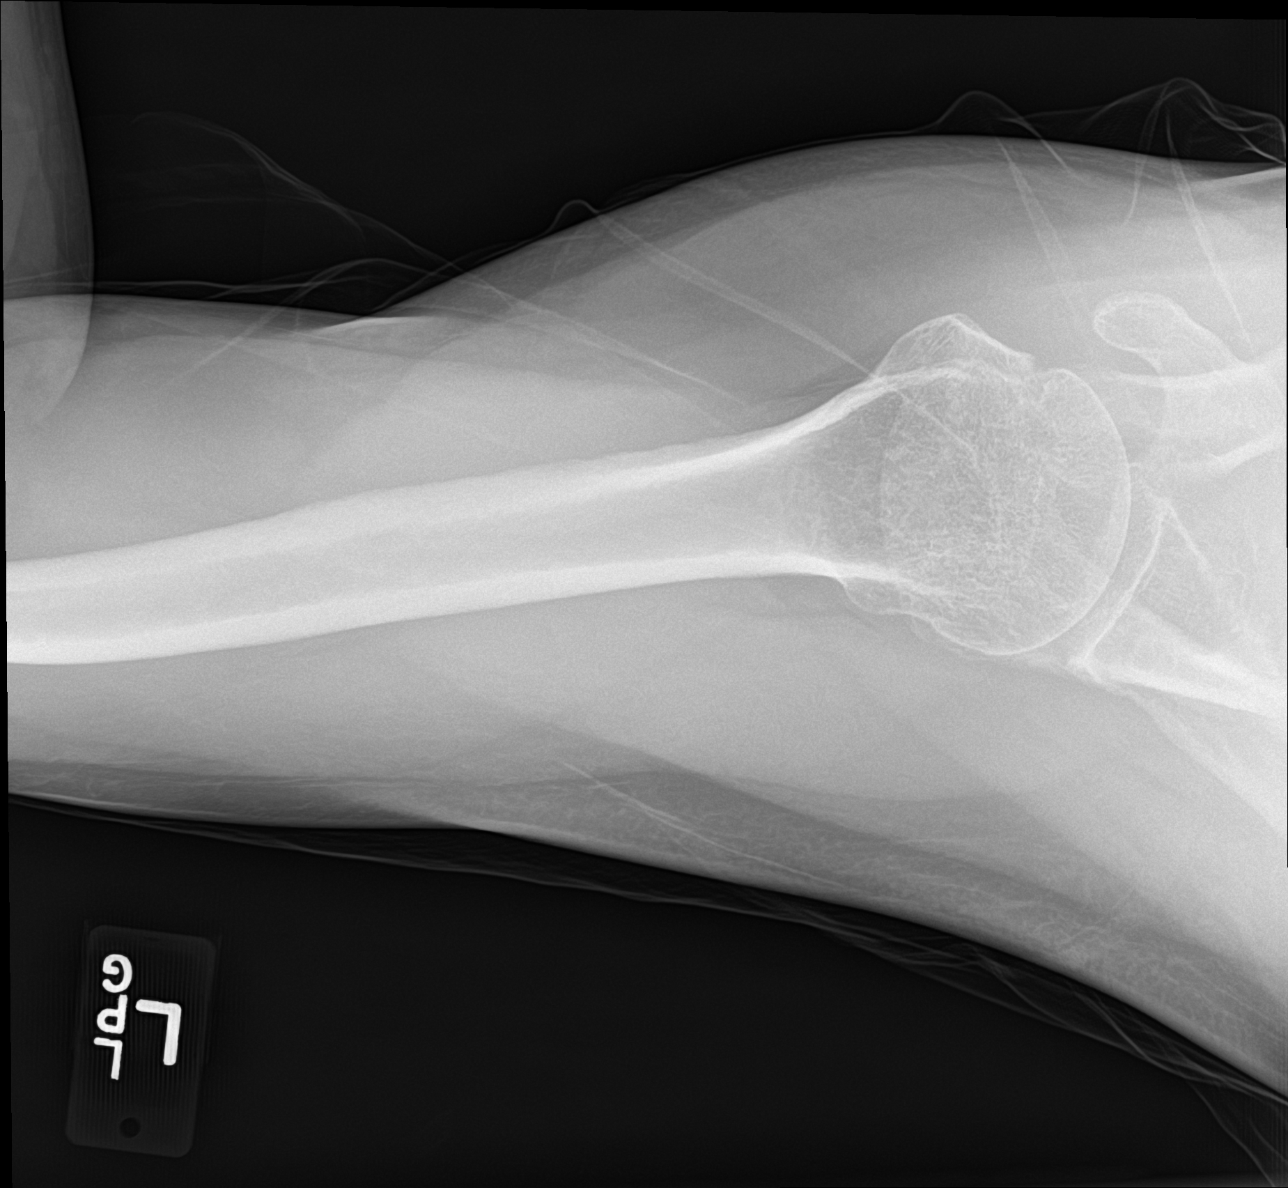

[3 of 3 positions shown; findings below may reference images not displayed]

FINDINGS: Minimal inferior clavicular spur formation at the AC joint. Small
humeral head cyst. Minimal inferior glenoid spur formation.
Otherwise, normal appearing bones and soft tissues.
IMPRESSION: Minimal degenerative changes.

## 2021-03-23 ENCOUNTER — Encounter: Payer: Self-pay | Admitting: Internal Medicine

## 2022-12-02 ENCOUNTER — Encounter: Payer: Self-pay | Admitting: Gastroenterology
